# Patient Record
Sex: Female | Born: 1965 | Race: Asian | Hispanic: No | Marital: Married | Smoking: Never smoker
Health system: Southern US, Community
[De-identification: ages and names within clinical notes are randomized; demographics above are authoritative.]

---

## 2009-03-18 DIAGNOSIS — M25561 Pain in right knee: Secondary | ICD-10-CM | POA: Insufficient documentation

## 2019-09-15 DIAGNOSIS — K0889 Other specified disorders of teeth and supporting structures: Secondary | ICD-10-CM | POA: Insufficient documentation

## 2020-03-17 ENCOUNTER — Other Ambulatory Visit: Payer: Self-pay

## 2020-03-22 ENCOUNTER — Other Ambulatory Visit: Payer: Self-pay | Admitting: Internal Medicine

## 2020-03-23 LAB — LIPID PANEL W/O CHOL/HDL RATIO
Cholesterol, Total: 238 mg/dL — ABNORMAL HIGH (ref 100–199)
HDL: 73 mg/dL (ref >39)
LDL Chol Calc (NIH): 148 mg/dL — ABNORMAL HIGH (ref 0–99)
Triglycerides: 97 mg/dL (ref 0–149)
VLDL Cholesterol Cal: 17 mg/dL (ref 5–40)

## 2020-03-23 LAB — URINALYSIS, ROUTINE W REFLEX MICROSCOPIC
Bilirubin, UA: NEGATIVE
Glucose, UA: NEGATIVE
Ketones, UA: NEGATIVE
Nitrite, UA: NEGATIVE
Protein,UA: NEGATIVE
RBC, UA: NEGATIVE
Specific Gravity, UA: 1.015 (ref 1.005–1.030)
Urobilinogen, Ur: 0.2 mg/dL (ref 0.2–1.0)
pH, UA: 5.5 (ref 5.0–7.5)

## 2020-03-23 LAB — CBC WITH DIFFERENTIAL/PLATELET
Basophils Absolute: 0 10*3/uL (ref 0.0–0.2)
Basos: 1 %
EOS (ABSOLUTE): 0.1 10*3/uL (ref 0.0–0.4)
Eos: 1 %
Hematocrit: 43.3 % (ref 34.0–46.6)
Hemoglobin: 14.6 g/dL (ref 11.1–15.9)
Immature Grans (Abs): 0 10*3/uL (ref 0.0–0.1)
Immature Granulocytes: 0 %
Lymphocytes Absolute: 3.4 10*3/uL — ABNORMAL HIGH (ref 0.7–3.1)
Lymphs: 45 %
MCH: 30.3 pg (ref 26.6–33.0)
MCHC: 33.7 g/dL (ref 31.5–35.7)
MCV: 90 fL (ref 79–97)
Monocytes Absolute: 0.7 10*3/uL (ref 0.1–0.9)
Monocytes: 9 %
Neutrophils Absolute: 3.3 10*3/uL (ref 1.4–7.0)
Neutrophils: 44 %
Platelets: 190 10*3/uL (ref 150–450)
RBC: 4.82 x10E6/uL (ref 3.77–5.28)
RDW: 11.6 % — ABNORMAL LOW (ref 11.7–15.4)
WBC: 7.6 10*3/uL (ref 3.4–10.8)

## 2020-03-23 LAB — COMPREHENSIVE METABOLIC PANEL
ALT: 10 IU/L (ref 0–32)
AST: 14 IU/L (ref 0–40)
Albumin/Globulin Ratio: 1.7 (ref 1.2–2.2)
Albumin: 4.6 g/dL (ref 3.8–4.9)
Alkaline Phosphatase: 68 IU/L (ref 44–121)
BUN/Creatinine Ratio: 14 (ref 9–23)
BUN: 11 mg/dL (ref 6–24)
Bilirubin Total: 0.4 mg/dL (ref 0.0–1.2)
CO2: 23 mmol/L (ref 20–29)
Calcium: 9.8 mg/dL (ref 8.7–10.2)
Chloride: 103 mmol/L (ref 96–106)
Creatinine, Ser: 0.76 mg/dL (ref 0.57–1.00)
GFR calc Af Amer: 102 mL/min/{1.73_m2} (ref >59)
GFR calc non Af Amer: 89 mL/min/{1.73_m2} (ref >59)
Globulin, Total: 2.7 g/dL (ref 1.5–4.5)
Glucose: 101 mg/dL — ABNORMAL HIGH (ref 65–99)
Potassium: 4.5 mmol/L (ref 3.5–5.2)
Sodium: 142 mmol/L (ref 134–144)
Total Protein: 7.3 g/dL (ref 6.0–8.5)

## 2020-03-23 LAB — MICROSCOPIC EXAMINATION
Bacteria, UA: NONE SEEN
Casts: NONE SEEN /lpf
RBC, Urine: NONE SEEN /hpf (ref 0–2)

## 2020-03-23 LAB — HGB A1C W/O EAG: Hgb A1c MFr Bld: 5.9 % — ABNORMAL HIGH (ref 4.8–5.6)

## 2020-03-23 LAB — TSH: TSH: 2.35 u[IU]/mL (ref 0.450–4.500)

## 2020-03-23 LAB — T4, FREE: Free T4: 1.26 ng/dL (ref 0.82–1.77)

## 2020-04-08 ENCOUNTER — Ambulatory Visit: Payer: Self-pay | Admitting: Adult Health

## 2020-04-22 ENCOUNTER — Ambulatory Visit: Payer: Self-pay | Admitting: Internal Medicine

## 2020-04-22 ENCOUNTER — Encounter: Payer: Self-pay | Admitting: Internal Medicine

## 2020-04-22 ENCOUNTER — Other Ambulatory Visit: Payer: Self-pay

## 2020-04-22 VITALS — BP 131/93 | HR 64 | Temp 98.0°F | Ht 62.0 in | Wt 137.2 lb

## 2020-04-22 DIAGNOSIS — E785 Hyperlipidemia, unspecified: Secondary | ICD-10-CM

## 2020-04-22 DIAGNOSIS — I10 Essential (primary) hypertension: Secondary | ICD-10-CM | POA: Insufficient documentation

## 2020-04-22 DIAGNOSIS — K029 Dental caries, unspecified: Secondary | ICD-10-CM

## 2020-04-22 NOTE — Assessment & Plan Note (Signed)
Patient has a caries of the teeth, has an ill fitting denture made in her home country which is causing immense pain. She has multiple caries tooth in the front which needs immediate attention by a dentist.  I told her to keep denture off, gave her ampicillin 500 mg for 10 days.

## 2020-04-22 NOTE — Assessment & Plan Note (Signed)
Patient was started on losartin 50 mg daily and was given meds for 3 months.

## 2020-04-22 NOTE — Assessment & Plan Note (Signed)
Patient was advised to follow a no cholesterol diet.

## 2020-04-22 NOTE — Progress Notes (Signed)
Acute Office Visit  Subjective:    Patient ID: Lori Simmons, female    DOB: 1966-01-03, 55 y.o.   MRN: [id number]  No chief complaint on file.   HPI Patient is in today for pain in the tooth, complains of pain from dentures.  The denture was made in [place] and is ill fitting to the teeth. Her teeth were not taken care of so she continued to have multiple problems. Advised not to use dentures, given list of local available dentist who will take care of her teeth.   History reviewed. No pertinent past medical history.  History reviewed. No pertinent surgical history.  History reviewed. No pertinent family history.  Social History   Socioeconomic History  . Marital status: Married    Spouse name: Niamatullah  . Number of children: 7  . Years of education: Not on file  . Highest education level: Not on file  Occupational History  . Occupation: unemployed  Tobacco Use  . Smoking status: Never Smoker  . Smokeless tobacco: Never Used  Vaping Use  . Vaping Use: Never used  Substance and Sexual Activity  . Alcohol use: Never  . Drug use: Never  . Sexual activity: Not on file  Other Topics Concern  . Not on file  Social History Narrative  . Not on file   Social Determinants of Health   Financial Resource Strain: Not on file  Food Insecurity: Not on file  Transportation Needs: Not on file  Physical Activity: Not on file  Stress: Not on file  Social Connections: Not on file  Intimate Partner Violence: Not on file    No outpatient medications prior to visit.   No facility-administered medications prior to visit.    No Known Allergies  Review of Systems  Constitutional: Negative.   HENT: Positive for dental problem.   Eyes: Negative.   Respiratory: Negative.   Cardiovascular: Negative.   Gastrointestinal: Negative.   Endocrine: Negative.   Genitourinary: Negative.   Musculoskeletal: Negative.   Skin: Negative.   Allergic/Immunologic: Negative.    Neurological: Negative.   Hematological: Negative.   Psychiatric/Behavioral: Negative.   All other systems reviewed and are negative.      Objective:    Physical Exam  Multiple caries  Teeth in upper jaw.  Constitutional: The patient is oriented to person, place, and time. Pt appears well-developed and well-nourished.  Head: Normocephalic and atraumatic.  Eyes: Pupils are equal, round, and reactive to light.  Neck: No JVD present. No tracheal deviation present. No thyromegaly present.  Cardiovascular: Regular rate and rhythm. No gallop. Pulmonary/Chest: Normal breath sounds. Lungs clear to auscultation. Abdominal: No abdominal tenderness. No guarding or rebound tenderness. No hepatosplenomegaly. Musculoskeletal: Normal range of motion.  Lymphatic: No cervical adenopathy.  Neurological: No cranial nerve deficit.  Skin: Skin is warm and hydrated.  Psychiatric: The patient has a normal mood and affect. Patient complains of knee and back pain  BP (!) 131/93 (BP Location: Left Arm, Patient Position: Sitting, Cuff Size: Normal)   Pulse 64   Temp 98 F (36.7 C) (Oral)   Ht 5\' 2"  (1.575 m)   Wt 137 lb 3.2 oz (62.2 kg)   SpO2 98%   BMI 25.09 kg/m  Wt Readings from Last 3 Encounters:  04/22/20 137 lb 3.2 oz (62.2 kg)    Health Maintenance Due  Topic Date Due  . Hepatitis C Screening  Never done  . HIV Screening  Never done  . PAP SMEAR-Modifier  Never done  .  COLONOSCOPY (Pts 45-62yrs Insurance coverage will need to be confirmed)  Never done  . MAMMOGRAM  Never done  . INFLUENZA VACCINE  Never done  . COVID-19 Vaccine (2 - Booster for Janssen series) 01/13/2020    There are no preventive care reminders to display for this patient.   Lab Results  Component Value Date   TSH 2.350 03/22/2020   Lab Results  Component Value Date   WBC 7.6 03/22/2020   HGB 14.6 03/22/2020   HCT 43.3 03/22/2020   MCV 90 03/22/2020   PLT 190 03/22/2020   Lab Results  Component Value  Date   NA 142 03/22/2020   K 4.5 03/22/2020   CO2 23 03/22/2020   GLUCOSE 101 (H) 03/22/2020   BUN 11 03/22/2020   CREATININE 0.76 03/22/2020   BILITOT 0.4 03/22/2020   ALKPHOS 68 03/22/2020   AST 14 03/22/2020   ALT 10 03/22/2020   PROT 7.3 03/22/2020   ALBUMIN 4.6 03/22/2020   CALCIUM 9.8 03/22/2020   Lab Results  Component Value Date   CHOL 238 (H) 03/22/2020   Lab Results  Component Value Date   HDL 73 03/22/2020   Lab Results  Component Value Date   LDLCALC 148 (H) 03/22/2020   Lab Results  Component Value Date   TRIG 97 03/22/2020   No results found for: CHOLHDL Lab Results  Component Value Date   HGBA1C 5.9 (H) 03/22/2020       Assessment & Plan:   Problem List Items Addressed This Visit      Cardiovascular and Mediastinum   Essential hypertension    Patient was started on losartin 50 mg daily and was given meds for 3 months.         Digestive   Caries    Patient has a caries of the teeth, has an ill fitting denture made in her [place] which is causing immense pain. She has multiple caries tooth in the front which needs immediate attention by a dentist.  I told her to keep denture off, gave her ampicillin 500 mg for 10 days.         Other   Hyperlipidemia - Primary    Patient was advised to follow a no cholesterol diet.          No orders of the defined types were placed in this encounter.  gave her amoxicillin, and crozat from clinic pharmacy.  Advised to call dentist for an urgent appointment.    Corky Downs, MD

## 2020-06-04 ENCOUNTER — Telehealth (HOSPITAL_COMMUNITY): Payer: Self-pay | Admitting: Emergency Medicine

## 2020-06-04 ENCOUNTER — Encounter (HOSPITAL_COMMUNITY): Payer: Self-pay

## 2020-06-04 ENCOUNTER — Other Ambulatory Visit: Payer: Self-pay

## 2020-06-04 ENCOUNTER — Ambulatory Visit (INDEPENDENT_AMBULATORY_CARE_PROVIDER_SITE_OTHER): Payer: Medicaid Other

## 2020-06-04 ENCOUNTER — Ambulatory Visit (HOSPITAL_COMMUNITY)
Admission: EM | Admit: 2020-06-04 | Discharge: 2020-06-04 | Disposition: A | Discharge status: Home / Self Care | Payer: Medicaid Other | Attending: Emergency Medicine | Admitting: Emergency Medicine

## 2020-06-04 DIAGNOSIS — R0789 Other chest pain: Secondary | ICD-10-CM | POA: Diagnosis not present

## 2020-06-04 DIAGNOSIS — R079 Chest pain, unspecified: Secondary | ICD-10-CM | POA: Diagnosis not present

## 2020-06-04 MED ORDER — IBUPROFEN 800 MG PO TABS
800.0000 mg | ORAL_TABLET | Freq: Once | ORAL | Status: AC
  Administered 2020-06-04: 800 mg via ORAL

## 2020-06-04 MED ORDER — IBUPROFEN 600 MG PO TABS: 600.0000 mg | ORAL_TABLET | Freq: Four times a day (QID) | ORAL | 0 refills | Status: DC | PRN

## 2020-06-04 MED ORDER — IBUPROFEN 800 MG PO TABS
ORAL_TABLET | ORAL | Status: AC
  Filled 2020-06-04: qty 1

## 2020-06-04 MED ORDER — ACETAMINOPHEN 325 MG PO TABS
975.0000 mg | ORAL_TABLET | Freq: Once | ORAL | Status: AC
  Administered 2020-06-04: 975 mg via ORAL

## 2020-06-04 MED ORDER — ACETAMINOPHEN 325 MG PO TABS
ORAL_TABLET | ORAL | Status: AC
  Filled 2020-06-04: qty 3

## 2020-06-04 NOTE — ED Triage Notes (Signed)
Pt present right upper side breast pain. Symptoms started 1 week ago. Pt states the symptoms is painful to the touch. Pt states there is no discharge from the breast.

## 2020-06-04 NOTE — ED Provider Notes (Signed)
HPI  SUBJECTIVE:  Lori Simmons is a 55 y.o. female who presents with right-sided chest pain for the past week.  She states that it is "in her bones".,  And is unable to characterize the pain.  She states that it is constant.  She reports an occasional nonproductive cough, and states that this pain radiates to her shoulder.  She is right-handed, denies any increase or change in her physical activity/lifting/repetitive movement.  She denies breast pain.  No rash.  No trauma to the chest.  No fevers, shortness of breath, hemoptysis, wheezing, night sweats, unintentional weight loss, exertional or positional component, calf pain, swelling, surgery in the past 4 weeks, recent immobilization.  No nausea, diaphoresis, abdominal pain.  She has never had symptoms like this before.  She tried rest, ibuprofen.  The ibuprofen helps.  Symptoms worse with coughing, sneezing, raising her right arm above her head.  It is not associated with inspiration.  She denies pleuritic pain.  She has never had symptoms like this before.  She she thinks that she has a past medical history of hypertension, states that she is not taking any medications for it.  No history of breast cancer, diabetes, cancer, TB, pulmonary disease, smoking, PE, DVT, MI, coronary disease, hypercholesterolemia.  PMD: [facility]  All history obtained through language line.  History reviewed. No pertinent past medical history.  History reviewed. No pertinent surgical history.  History reviewed. No pertinent family history.  Social History   Tobacco Use  . Smoking status: Never Smoker  . Smokeless tobacco: Never Used  Vaping Use  . Vaping Use: Never used  Substance Use Topics  . Alcohol use: Never  . Drug use: Never    No current facility-administered medications for this encounter.  Current Outpatient Medications:  .  ibuprofen (ADVIL) 600 MG tablet, Take 1 tablet (600 mg total) by mouth every 6 (six) hours as needed., Disp: 30  tablet, Rfl: 0 .  losartan (COZAAR) 50 MG tablet, Take 50 mg by mouth daily., Disp: , Rfl:   No Known Allergies   ROS  As noted in HPI.   Physical Exam  BP 136/90 (BP Location: Left Arm)   Pulse 67   Temp 98.1 F (36.7 C) (Oral)   Resp 16   SpO2 97%   Constitutional: Well developed, well nourished, no acute distress Eyes:  EOMI, conjunctiva normal bilaterally HENT: Normocephalic, atraumatic,mucus membranes moist Respiratory: Normal inspiratory effort, lungs clear bilaterally.  Normal chest wall appearance.  No bruising, rash.  Positive isolated right-sided lateral chest wall tenderness. Breast: No right breast tenderness. Lymph: No right axillary lymphadenopathy Cardiovascular: Normal rate, regular rhythm no murmurs rubs or gallops GI: nondistended soft, nontender, negative Murphy.  Active bowel sounds.  No rebound, guarding. skin: No rash, skin intact Musculoskeletal: Calves symmetric, nontender, no edema. Right shoulder nontender. Neurologic: Alert & oriented x 3, no focal neuro deficits Psychiatric: Speech and behavior appropriate   ED Course   Medications  acetaminophen (TYLENOL) tablet 975 mg (975 mg Oral Given 06/04/20 1617)  ibuprofen (ADVIL) tablet 800 mg (800 mg Oral Given 06/04/20 1617)    Orders Placed This Encounter  Procedures  . DG Chest 2 View    Standing Status:   Standing    Number of Occurrences:   1    Order Specific Question:   Reason for Exam (SYMPTOM  OR DIAGNOSIS REQUIRED)    Answer:   right sided CP  . ED EKG    Standing Status:  Standing    Number of Occurrences:   1    Order Specific Question:   Reason for Exam    Answer:   Chest Pain    No results found for this or any previous visit (from the past 24 hour(s)). DG Chest 2 View  Result Date: 06/04/2020 CLINICAL DATA:  Right side chest pain EXAM: CHEST - 2 VIEW COMPARISON:  None. FINDINGS: Heart and mediastinal contours are within normal limits. No focal opacities or effusions. No  acute bony abnormality. IMPRESSION: No active cardiopulmonary disease. Electronically Signed   By: Charlett Nose M.D.   On: 06/04/2020 16:31    ED Clinical Impression  1. Chest wall pain      ED Assessment/Plan  Presentation consistent with musculoskeletal chest pain, however, will check an EKG and chest x-ray.  Vitals normal, calves symmetric, nontender, doubt  PE.  Giving ibuprofen 800 mg and 975 mg of Tylenol here.  If everything is normal, plan to send home with 600 mg of ibuprofen combined with 1000 milligrams Tylenol 3-4 times a day.  Follow-up with the [facility] in 3-5 days.  Strict ER return precautions given.  Reviewed imaging independently.  Normal chest x-ray.  See radiology report for full details.  EKG: Sinus bradycardia, rate 57.  Left axis deviation.  Normal intervals.  No hypertrophy.  No ST elevation.  T wave inversion from previous EKG 03/17/2020 resolved.  EKG, chest x-ray reassuring.  Plan as above.  Spent over 45 minutes obtaining H&P, discussing medical decision-making, work-up, treatment plan, plan for follow-up and ER return precautions with patient using the language line. patient agrees with plan.   Meds ordered this encounter  Medications  . acetaminophen (TYLENOL) tablet 975 mg  . ibuprofen (ADVIL) tablet 800 mg  . ibuprofen (ADVIL) 600 MG tablet    Sig: Take 1 tablet (600 mg total) by mouth every 6 (six) hours as needed.    Dispense:  30 tablet    Refill:  0    *This clinic note was created using Scientist, clinical (histocompatibility and immunogenetics). Therefore, there may be occasional mistakes despite careful proofreading.   ?    Domenick Gong, MD 06/05/20 [phone]

## 2020-06-04 NOTE — Discharge Instructions (Addendum)
Your EKG and chest x-ray were normal.  I suspect that this is musculoskeletal.  Take 600 mg of ibuprofen combined with 1000 mg of Tylenol together 3-4 times a day as needed for pain.  Follow-up with your doctor if not getting better in 2 days, go immediately to the ER if you get worse, or for other concerns.

## 2020-06-04 NOTE — ED Notes (Signed)
Patient transported to X-ray 

## 2021-02-27 ENCOUNTER — Telehealth: Payer: Self-pay

## 2021-02-27 NOTE — Telephone Encounter (Signed)
Patient is requesting transportation assistance, ride scheduled with [employer] assistance. Pick up time 1:41pm on December 14th.  Nicole Cella Nachmen Mansel RN [facility]  [facility] [phone]

## 2022-01-23 ENCOUNTER — Ambulatory Visit: Payer: Medicaid Other

## 2022-01-23 DIAGNOSIS — Z23 Encounter for immunization: Secondary | ICD-10-CM

## 2022-08-26 IMAGING — DX DG CHEST 2V
2 series · 2 of 2 positions shown · non-contrast
Comparison: None.

CLINICAL DATA: Right side chest pain

EXAM:
CHEST - 2 VIEW

[chest pa]
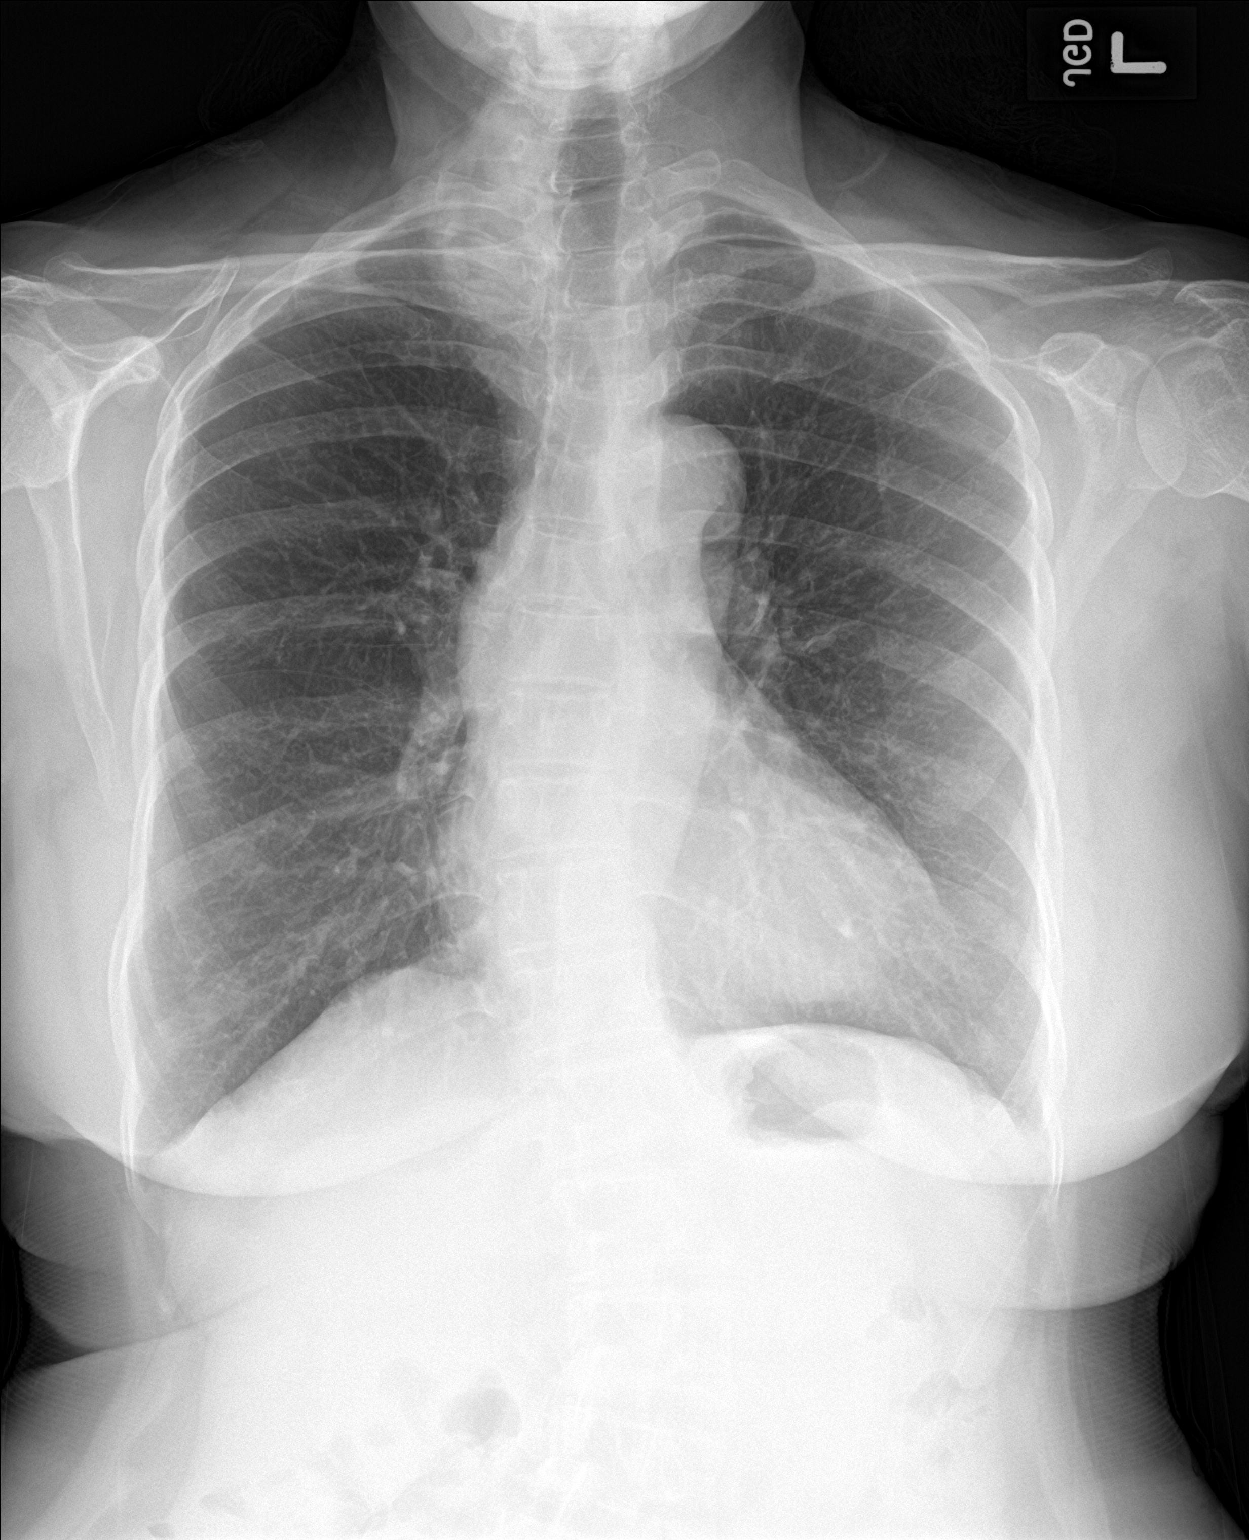

[chest lat]
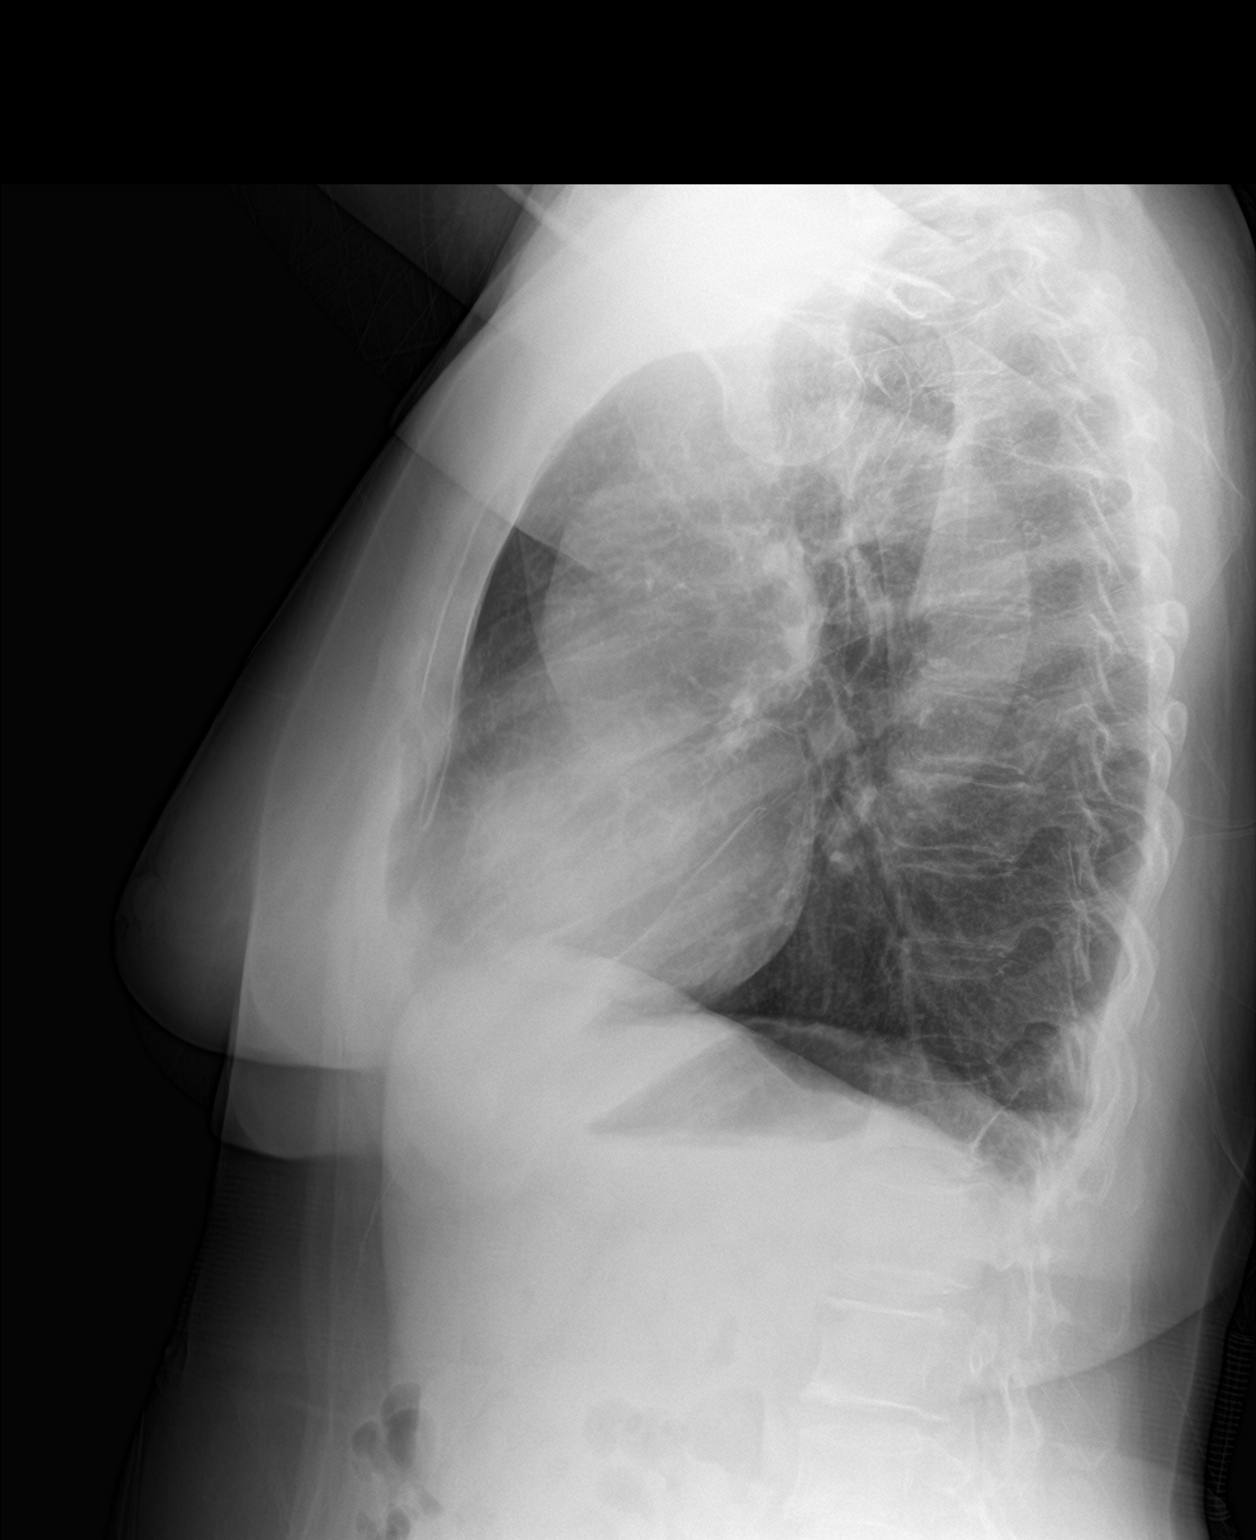

[2 of 2 positions shown; findings below may reference images not displayed]

FINDINGS: Heart and mediastinal contours are within normal limits. No focal
opacities or effusions. No acute bony abnormality.
IMPRESSION: No active cardiopulmonary disease.

## 2022-11-25 ENCOUNTER — Ambulatory Visit (HOSPITAL_COMMUNITY)
Admission: EM | Admit: 2022-11-25 | Discharge: 2022-11-25 | Disposition: A | Discharge status: Home / Self Care | Payer: Self-pay | Attending: Emergency Medicine | Admitting: Emergency Medicine

## 2022-11-25 ENCOUNTER — Other Ambulatory Visit: Payer: Self-pay

## 2022-11-25 ENCOUNTER — Encounter (HOSPITAL_COMMUNITY): Payer: Self-pay | Admitting: *Deleted

## 2022-11-25 DIAGNOSIS — L239 Allergic contact dermatitis, unspecified cause: Secondary | ICD-10-CM

## 2022-11-25 MED ORDER — PREDNISONE 10 MG (21) PO TBPK: ORAL_TABLET | Freq: Every day | ORAL | 0 refills | Status: DC

## 2022-11-25 MED ORDER — METHYLPREDNISOLONE ACETATE 40 MG/ML IJ SUSP
40.0000 mg | Freq: Once | INTRAMUSCULAR | Status: AC
  Administered 2022-11-25: 40 mg via INTRAMUSCULAR

## 2022-11-25 MED ORDER — METHYLPREDNISOLONE ACETATE 40 MG/ML IJ SUSP
INTRAMUSCULAR | Status: AC
  Filled 2022-11-25: qty 1

## 2022-11-25 NOTE — ED Provider Notes (Signed)
[facility]    CSN: [id number] Arrival date & time: 11/25/22  1611      History   Chief Complaint Chief Complaint  Patient presents with   Facial Swelling    HPI Lori Simmons is a 57 y.o. female.   Patient reports since with rash to face and neck with intermittent facial swelling x 1 week. Patient reports taking Benadryl at bedtime to help with itching with relief. Denies fever and shortness of breath. Unsure of triggers.     History reviewed. No pertinent past medical history.  Patient Active Problem List   Diagnosis Date Noted   Hyperlipidemia 04/22/2020   Essential hypertension 04/22/2020   Caries 04/22/2020   Pain in a tooth or teeth 09/15/2019   Knee pain, bilateral 2011    History reviewed. No pertinent surgical history.  OB History   No obstetric history on file.      Home Medications    Prior to Admission medications   Medication Sig Start Date End Date Taking? Authorizing Provider  predniSONE (STERAPRED UNI-PAK 21 TAB) 10 MG (21) TBPK tablet Take by mouth daily. Take 6 tabs by mouth daily  for 2 days, then 5 tabs for 2 days, then 4 tabs for 2 days, then 3 tabs for 2 days, 2 tabs for 2 days, then 1 tab by mouth daily for 2 days 11/25/22  Yes Letta Kocher, NP    Family History History reviewed. No pertinent family history.  Social History Social History   Tobacco Use   Smoking status: Never   Smokeless tobacco: Never  Vaping Use   Vaping status: Never Used  Substance Use Topics   Alcohol use: Never   Drug use: Never     Allergies   Patient has no known allergies.   Review of Systems Review of Systems  Constitutional:  Negative for activity change, chills, fatigue and fever.  Respiratory:  Negative for shortness of breath.   Skin:  Positive for rash.     Physical Exam Triage Vital Signs ED Triage Vitals  Encounter Vitals Group     BP 11/25/22 1852 118/79     Systolic BP Percentile --      Diastolic BP  Percentile --      Pulse Rate 11/25/22 1852 60     Resp 11/25/22 1852 18     Temp 11/25/22 1852 98.6 F (37 C)     Temp src --      SpO2 11/25/22 1852 96 %     Weight --      Height --      Head Circumference --      Peak Flow --      Pain Score 11/25/22 1849 0     Pain Loc --      Pain Education --      Exclude from Growth Chart --    No data found.  Updated Vital Signs BP 118/79   Pulse 60   Temp 98.6 F (37 C)   Resp 18   SpO2 96%   Visual Acuity Right Eye Distance:   Left Eye Distance:   Bilateral Distance:    Right Eye Near:   Left Eye Near:    Bilateral Near:     Physical Exam Vitals and nursing note reviewed.  Constitutional:      General: She is awake. She is not in acute distress.    Appearance: Normal appearance. She is well-developed and well-groomed. She is not ill-appearing, toxic-appearing  or diaphoretic.  HENT:     Nose: Nose normal.     Mouth/Throat:     Mouth: Mucous membranes are moist.     Pharynx: Oropharynx is clear.  Musculoskeletal:     Cervical back: Normal range of motion. Erythema present.  Skin:    General: Skin is warm and dry.     Findings: Erythema and rash present. No bruising, lesion or petechiae. Rash is macular.     Comments: Macular rash to eyelids, under eyes, and to neck.   Neurological:     Mental Status: She is alert.  Psychiatric:        Behavior: Behavior is cooperative.      UC Treatments / Results  Labs (all labs ordered are listed, but only abnormal results are displayed) Labs Reviewed - No data to display  EKG   Radiology No results found.  Procedures Procedures (including critical care time)  Medications Ordered in UC Medications  methylPREDNISolone acetate (DEPO-MEDROL) injection 40 mg (40 mg Intramuscular Given 11/25/22 1913)    Initial Impression / Assessment and Plan / UC Course  I have reviewed the triage vital signs and the nursing notes.  Pertinent labs & imaging results that were  available during my care of the patient were reviewed by me and considered in my medical decision making (see chart for details).     Patient presented with rash to face and neck with intermittent facial swelling x 1 week.  Patient reports taking Benadryl at bedtime to help with itching with some relief. Denies fever rash of breath. Unsure of triggers. Patient has macular rash to bilateral eyelids, under eyes, and neck.  Patient does not have facial swelling at this time. Given steroid injection in clinic.  Prescribed prednisone dose pack.  Recommended continuing Benadryl if it helps with itching. Given ambulatory referral to allergist. Discussed follow-up and return protocols Final Clinical Impressions(s) / UC Diagnoses   Final diagnoses:  Allergic contact dermatitis, unspecified trigger     Discharge Instructions      Start taking Prednisone as prescribed until finished to help with rash. Otherwise continue using Benadryl for itching. Only take 50mg  at a time. Return here if symptoms persist. You can also follow up with allergy specialist to help determine possible triggers.     ED Prescriptions     Medication Sig Dispense Auth. Provider   predniSONE (STERAPRED UNI-PAK 21 TAB) 10 MG (21) TBPK tablet Take by mouth daily. Take 6 tabs by mouth daily  for 2 days, then 5 tabs for 2 days, then 4 tabs for 2 days, then 3 tabs for 2 days, 2 tabs for 2 days, then 1 tab by mouth daily for 2 days 42 tablet Wynonia Lawman A, NP      PDMP not reviewed this encounter.   Wynonia Lawman A, NP 11/25/22 [phone]

## 2022-11-25 NOTE — ED Triage Notes (Signed)
Pt has had facial swelling to face for one week. Family reports allergy but does not know the cause. Pt took benadryl four caps at bedtime for 4 days. Facial swelling has not improved.

## 2022-11-25 NOTE — Discharge Instructions (Addendum)
Start taking Prednisone as prescribed until finished to help with rash. Otherwise continue using Benadryl for itching. Only take 50mg  at a time. Return here if symptoms persist. You can also follow up with allergy specialist to help determine possible triggers.

## 2023-01-03 ENCOUNTER — Ambulatory Visit: Payer: Self-pay

## 2023-01-03 ENCOUNTER — Ambulatory Visit
Admission: EM | Admit: 2023-01-03 | Discharge: 2023-01-03 | Disposition: A | Discharge status: Home / Self Care | Payer: BLUE CROSS/BLUE SHIELD | Attending: Physician Assistant | Admitting: Physician Assistant

## 2023-01-03 ENCOUNTER — Encounter: Payer: Self-pay | Admitting: Internal Medicine

## 2023-01-03 DIAGNOSIS — L209 Atopic dermatitis, unspecified: Secondary | ICD-10-CM

## 2023-01-03 DIAGNOSIS — H01132 Eczematous dermatitis of right lower eyelid: Secondary | ICD-10-CM

## 2023-01-03 DIAGNOSIS — H01134 Eczematous dermatitis of left upper eyelid: Secondary | ICD-10-CM

## 2023-01-03 DIAGNOSIS — H01135 Eczematous dermatitis of left lower eyelid: Secondary | ICD-10-CM

## 2023-01-03 DIAGNOSIS — H01131 Eczematous dermatitis of right upper eyelid: Secondary | ICD-10-CM

## 2023-01-03 MED ORDER — CETIRIZINE HCL 10 MG PO TABS: 10.0000 mg | ORAL_TABLET | Freq: Every day | ORAL | 2 refills | Status: DC

## 2023-01-03 MED ORDER — HYDROCORTISONE 0.5 % EX CREA: 1.0000 | TOPICAL_CREAM | Freq: Two times a day (BID) | CUTANEOUS | 0 refills | Status: AC

## 2023-01-03 NOTE — ED Triage Notes (Addendum)
Pt presents to UC w/ c/o pain, redness and itching around eyes started after last dose of prednisone from last visit. Pt states at times there is "a film over her eyes."  Small blister present on right upper eyelid. Rash has also come up around her mouth and neck.

## 2023-01-03 NOTE — Discharge Instructions (Signed)
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  Give cetirizine at night.  Use hydrocortisone cream on the areas surrounding her eyes up to twice a day for 2 weeks.  Do not use this for longer than 2 weeks as it can cause thinning of the skin.  Try to avoid scratching or irritating the area.  Use hypoallergenic soaps and detergents.  Avoid cosmetics.  If symptoms are not improving within a week or if anything worsens and we have additional rash, fever, nausea, vomiting she should be seen immediately.  ??? ?? ??? ??? ?????? ?????? ???.  ?? ??? ?? cetirizine ?????.  ? ?????????????? ???? ? ????? ?????? ???? ?? ? 2 ????? ????? ?? ??? ?? ??? ??? ??????.  ?? ? 2 ????? ??? ??? ?? ????? ??? ?? ?? ???? ?? ? ?????? ?????? ???? ??.  ??? ???? ?? ? ???? ???? ?? ???? ??? ?????? ????.  ????????????? ????? ?? ????? ??????.  ? ???????? ??? ??? ????.  ?? ? ??? ???? ?? ????? ?? ??? ?? ??? ?? ??? ?? ???? ?? ?? ??? ????? ???? ? ??? ? ??????? ? ????? ???? ??? ???? ??????? ????? ??. zuma paa und valagha alarjik akazima lari.  paa shpa ke cetirizine warkri.  da valydrokortixon krem da stargu shaokhwa simo ke da 2 awnio lapara paa warz ke dawa zala wakarwai.  da da 2 awnio sakha der maa Sander Nephew zaka chi da [place] shi da postaki narmedo lamal shi.  Roselyn Reef chi da sahey kharkh ya kharkh sakha mahnewi wakri.  valipowalarjinik sabun ao sabun wakarwai.  da kasametax sakha dada [place] da yowe awani paa ugdo ke nakhe kha nashi ya kom saa [place] shi ao mog izafi [place] , [place] , [place] , kangi walaro valagha bayed samdalasa walidal shi.

## 2023-01-03 NOTE — ED Provider Notes (Signed)
[facility]    CSN: [id number] Arrival date & time: 01/03/23  1621      History   Chief Complaint No chief complaint on file.   HPI Lori Simmons is a 57 y.o. female.   Patient presents today accompanied by her children who provided translation and the majority of history.  Reports a prolonged (many months) history of erythematous and pruritic rash involving periorbital region.  She was seen for similar symptoms back in September 2024 for which when she was given a prednisone taper.  Her symptoms temporarily improved with this medication only to recur as soon as the medicine was stopped.  She was encouraged to follow-up with a primary care provider and call to schedule appointment but this was not until 02/19/2023.  She does not wear make-up regularly.  Denies any changes to personal hygiene products including soaps or detergents.  Denies any swelling of her throat, shortness of breath, muffled voice, wheezing, nausea, vomiting.  She has not tried any over-the-counter medication for symptom management.  She does report intermittent nasal congestion but denies additional symptoms.    History reviewed. No pertinent past medical history.  Patient Active Problem List   Diagnosis Date Noted   Hyperlipidemia 04/22/2020   Essential hypertension 04/22/2020   Caries 04/22/2020   Pain in a tooth or teeth 09/15/2019   Knee pain, bilateral 2011    History reviewed. No pertinent surgical history.  OB History   No obstetric history on file.      Home Medications    Prior to Admission medications   Medication Sig Start Date End Date Taking? Authorizing Provider  cetirizine (ZYRTEC ALLERGY) 10 MG tablet Take 1 tablet (10 mg total) by mouth at bedtime. 01/03/23  Yes Nyheim Seufert K, PA-C  hydrocortisone cream 0.5 % Apply 1 Application topically 2 (two) times daily. 01/03/23  Yes Keidrick Murty, Noberto Retort, PA-C    Family History History reviewed. No pertinent family history.  Social  History Social History   Tobacco Use   Smoking status: Never   Smokeless tobacco: Never  Vaping Use   Vaping status: Never Used  Substance Use Topics   Alcohol use: Never   Drug use: Never     Allergies   Patient has no known allergies.   Review of Systems Review of Systems  Constitutional:  Positive for activity change. Negative for appetite change, fatigue and fever.  HENT:  Negative for trouble swallowing and voice change.   Eyes:  Negative for photophobia and visual disturbance.  Gastrointestinal:  Negative for abdominal pain, diarrhea, nausea and vomiting.  Musculoskeletal:  Negative for arthralgias and myalgias.  Skin:  Positive for rash.  Neurological:  Negative for dizziness, light-headedness and headaches.     Physical Exam Triage Vital Signs ED Triage Vitals [01/03/23 1631]  Encounter Vitals Group     BP 130/84     Systolic BP Percentile      Diastolic BP Percentile      Pulse Rate 73     Resp 16     Temp 98.2 F (36.8 C)     Temp Source Oral     SpO2 94 %     Weight      Height      Head Circumference      Peak Flow      Pain Score 2     Pain Loc      Pain Education      Exclude from Growth Chart  No data found.  Updated Vital Signs BP 130/84 (BP Location: Right Arm)   Pulse 73   Temp 98.2 F (36.8 C) (Oral)   Resp 16   SpO2 94%   Visual Acuity Right Eye Distance:   Left Eye Distance:   Bilateral Distance:    Right Eye Near:   Left Eye Near:    Bilateral Near:     Physical Exam Vitals reviewed.  Constitutional:      General: She is awake. She is not in acute distress.    Appearance: Normal appearance. She is well-developed. She is not ill-appearing.     Comments: Very pleasant female appears stated age in no acute distress sitting comfortably in exam room  HENT:     Head: Normocephalic and atraumatic.     Right Ear: Tympanic membrane, ear canal and external ear normal. Tympanic membrane is not erythematous or bulging.      Left Ear: Tympanic membrane, ear canal and external ear normal. Tympanic membrane is not erythematous or bulging.     Nose:     Right Sinus: No maxillary sinus tenderness or frontal sinus tenderness.     Left Sinus: No maxillary sinus tenderness or frontal sinus tenderness.     Mouth/Throat:     Pharynx: Uvula midline. Postnasal drip present. No oropharyngeal exudate or posterior oropharyngeal erythema.  Eyes:     Extraocular Movements: Extraocular movements intact.     Conjunctiva/sclera: Conjunctivae normal.     Pupils: Pupils are equal, round, and reactive to light.     Comments: Erythematous rash left periorbital region bilaterally with evidence of excoriation.  No scaling, streaking, bleeding, additional lesions noted.  Cardiovascular:     Rate and Rhythm: Normal rate and regular rhythm.     Heart sounds: Normal heart sounds, S1 normal and S2 normal. No murmur heard. Pulmonary:     Effort: Pulmonary effort is normal.     Breath sounds: Normal breath sounds. No wheezing, rhonchi or rales.     Comments: Clear to auscultation bilaterally Psychiatric:        Behavior: Behavior is cooperative.      UC Treatments / Results  Labs (all labs ordered are listed, but only abnormal results are displayed) Labs Reviewed - No data to display  EKG   Radiology No results found.  Procedures Procedures (including critical care time)  Medications Ordered in UC Medications - No data to display  Initial Impression / Assessment and Plan / UC Course  I have reviewed the triage vital signs and the nursing notes.  Pertinent labs & imaging results that were available during my care of the patient were reviewed by me and considered in my medical decision making (see chart for details).     Patient is well-appearing, afebrile, nontoxic, nontachycardic.  No evidence of acute infection that would warrant initiation of antibiotics.  Discussed symptoms are most likely related to eczema given  clinical presentation.  Will start cetirizine daily and add hydrocortisone 0.5% up to twice a day for 2 weeks.  We discussed that this should not be used for more than 2 weeks due to concern for thinning of the skin and other complications.  She is to use hypoallergenic soaps and detergents and recommend that she avoid cosmetics.  Recommend close follow-up with primary care but if they are unable to see them any sooner and she continues to have symptoms she can return here for reevaluation.  Discussed that if anything worsens or changes she should return  for reevaluation.  All questions were answered to patient and caregiver satisfaction.  Final Clinical Impressions(s) / UC Diagnoses   Final diagnoses:  Atopic dermatitis, unspecified type  Eczematous dermatitis of upper and lower eyelids of both eyes     Discharge Instructions      I think she has allergic eczema.  Give cetirizine at night.  Use hydrocortisone cream on the areas surrounding her eyes up to twice a day for 2 weeks.  Do not use this for longer than 2 weeks as it can cause thinning of the skin.  Try to avoid scratching or irritating the area.  Use hypoallergenic soaps and detergents.  Avoid cosmetics.  If symptoms are not improving within a week or if anything worsens and we have additional rash, fever, nausea, vomiting she should be seen immediately.  ??? ?? ??? ??? ?????? ?????? ???.  ?? ??? ?? cetirizine ?????.  ? ?????????????? ???? ? ????? ?????? ???? ?? ? 2 ????? ????? ?? ??? ?? ??? ??? ??????.  ?? ? 2 ????? ??? ??? ?? ????? ??? ?? ?? ???? ?? ? ?????? ?????? ???? ??.  ??? ???? ?? ? ???? ???? ?? ???? ??? ?????? ????.  ????????????? ????? ?? ????? ??????.  ? ???????? ??? ??? ????.  ?? ? ??? ???? ?? ????? ?? ??? ?? ??? ?? ??? ?? ???? ?? ?? ??? ????? ???? ? ??? ? ??????? ? ????? ???? ??? ???? ??????? ????? ??. zuma paa und valagha alarjik akazima lari.  paa shpa ke cetirizine warkri.  da valydrokortixon krem da stargu shaokhwa simo ke  da 2 awnio lapara paa warz ke dawa zala wakarwai.  da da 2 awnio sakha der maa Sander Nephew zaka chi da [place] shi da postaki narmedo lamal shi.  Roselyn Reef chi da sahey kharkh ya kharkh sakha mahnewi wakri.  valipowalarjinik sabun ao sabun wakarwai.  da kasametax sakha dada [place] da yowe awani paa ugdo ke nakhe kha nashi ya kom saa [place] shi ao mog izafi [employer] , [place] , [place] , kangi walaro valagha bayed samdalasa walidal shi.     ED Prescriptions     Medication Sig Dispense Auth. Provider   cetirizine (ZYRTEC ALLERGY) 10 MG tablet Take 1 tablet (10 mg total) by mouth at bedtime. 30 tablet Neala Miggins K, PA-C   hydrocortisone cream 0.5 % Apply 1 Application topically 2 (two) times daily. 30 g Talha Iser, Noberto Retort, PA-C      PDMP not reviewed this encounter.   Jeani Hawking, PA-C 01/03/23 1654

## 2023-01-27 ENCOUNTER — Encounter: Payer: Self-pay | Admitting: Emergency Medicine

## 2023-01-27 ENCOUNTER — Other Ambulatory Visit: Payer: Self-pay

## 2023-01-27 ENCOUNTER — Ambulatory Visit
Admission: EM | Admit: 2023-01-27 | Discharge: 2023-01-27 | Disposition: A | Discharge status: Home / Self Care | Payer: BLUE CROSS/BLUE SHIELD | Attending: Internal Medicine | Admitting: Internal Medicine

## 2023-01-27 DIAGNOSIS — L209 Atopic dermatitis, unspecified: Secondary | ICD-10-CM | POA: Diagnosis not present

## 2023-01-27 DIAGNOSIS — H1012 Acute atopic conjunctivitis, left eye: Secondary | ICD-10-CM | POA: Diagnosis not present

## 2023-01-27 MED ORDER — TRIAMCINOLONE ACETONIDE 0.025 % EX OINT: 1.0000 | TOPICAL_OINTMENT | Freq: Two times a day (BID) | CUTANEOUS | 0 refills | Status: AC

## 2023-01-27 MED ORDER — CETIRIZINE HCL 10 MG PO TABS: 10.0000 mg | ORAL_TABLET | Freq: Every day | ORAL | 2 refills | Status: AC

## 2023-01-27 MED ORDER — AZELASTINE HCL 0.05 % OP SOLN
1.0000 [drp] | Freq: Two times a day (BID) | OPHTHALMIC | 0 refills | Status: AC
Start: 2023-01-27 — End: 2023-02-03

## 2023-01-27 NOTE — ED Notes (Signed)
Patient's adult son is aware of both appts for patient -the pcp appt and allergist appt

## 2023-01-27 NOTE — ED Provider Notes (Signed)
[facility]    CSN: [id number] Arrival date & time: 01/27/23  1558      History   Chief Complaint Chief Complaint  Patient presents with   Rash    HPI Lori Simmons is a 57 y.o. female presents for rash.  Patient is accompanied by family who helps to translate.  Patient has been seen 2 previous times in urgent care for atopic dermatitis.  First was in September when she was prescribed a prednisone taper.  Reports symptoms improved but then returned once she completed this.  She was again seen on October 18 for the same complaint.  She was prescribed cetirizine and hydrocortisone cream.  States this helped some but did not completely resolve.  Also endorses about a week of right eye redness with itching and watery drainage.  Denies history of eczema or psoriasis.  She has a follow-up appointment on December 4 with an allergist.  She also has an appointment with the PCP on November 18.  No other concerns at this time   Rash   History reviewed. No pertinent past medical history.  Patient Active Problem List   Diagnosis Date Noted   Hyperlipidemia 04/22/2020   Essential hypertension 04/22/2020   Caries 04/22/2020   Pain in a tooth or teeth 09/15/2019   Knee pain, bilateral 2011    History reviewed. No pertinent surgical history.  OB History   No obstetric history on file.      Home Medications    Prior to Admission medications   Medication Sig Start Date End Date Taking? Authorizing Provider  azelastine (OPTIVAR) 0.05 % ophthalmic solution Place 1 drop into the left eye 2 (two) times daily for 7 days. 01/27/23 02/03/23 Yes Radford Pax, NP  triamcinolone (KENALOG) 0.025 % ointment Apply 1 Application topically 2 (two) times daily. Apply sparingly to the neck and face.  Avoid eyes.  Do not use for more than 7 days. 01/27/23  Yes Radford Pax, NP  cetirizine (ZYRTEC ALLERGY) 10 MG tablet Take 1 tablet (10 mg total) by mouth at bedtime. 01/27/23   Radford Pax, NP  hydrocortisone cream 0.5 % Apply 1 Application topically 2 (two) times daily. 01/03/23   Raspet, Noberto Retort, PA-C    Family History History reviewed. No pertinent family history.  Social History Social History   Tobacco Use   Smoking status: Never   Smokeless tobacco: Never  Vaping Use   Vaping status: Never Used  Substance Use Topics   Alcohol use: Never   Drug use: Never     Allergies   Patient has no known allergies.   Review of Systems Review of Systems  Skin:  Positive for rash.     Physical Exam Triage Vital Signs ED Triage Vitals  Encounter Vitals Group     BP 01/27/23 1634 135/81     Systolic BP Percentile --      Diastolic BP Percentile --      Pulse Rate 01/27/23 1634 77     Resp 01/27/23 1634 18     Temp 01/27/23 1634 98.9 F (37.2 C)     Temp Source 01/27/23 1634 Oral     SpO2 01/27/23 1634 94 %     Weight --      Height --      Head Circumference --      Peak Flow --      Pain Score 01/27/23 1629 0     Pain Loc --  Pain Education --      Exclude from Growth Chart --    No data found.  Updated Vital Signs BP 135/81 (BP Location: Left Arm)   Pulse 77   Temp 98.9 F (37.2 C) (Oral)   Resp 18   SpO2 94%   Visual Acuity Right Eye Distance:   Left Eye Distance:   Bilateral Distance:    Right Eye Near:   Left Eye Near:    Bilateral Near:     Physical Exam Vitals and nursing note reviewed.  Constitutional:      General: She is not in acute distress.    Appearance: Normal appearance. She is not ill-appearing.  Eyes:     General: Lids are normal.     Conjunctiva/sclera:     Right eye: No chemosis, exudate or hemorrhage.    Pupils: Pupils are equal, round, and reactive to light.     Comments: Mild injection of conjunctiva on the left with watery drainage noted.  Cardiovascular:     Rate and Rhythm: Normal rate.  Pulmonary:     Effort: Pulmonary effort is normal.  Skin:    General: Skin is warm and dry.     Findings:  Rash present. Rash is macular and papular. Rash is not crusting, nodular, purpuric, pustular, scaling, urticarial or vesicular.     Comments: Scattered macular papular erythematous rash with dry skin noted on face, neck, chest, arms.  Neurological:     General: No focal deficit present.     Mental Status: She is alert and oriented to person, place, and time.  Psychiatric:        Mood and Affect: Mood normal.        Behavior: Behavior normal.      UC Treatments / Results  Labs (all labs ordered are listed, but only abnormal results are displayed) Labs Reviewed - No data to display  EKG   Radiology No results found.  Procedures Procedures (including critical care time)  Medications Ordered in UC Medications - No data to display  Initial Impression / Assessment and Plan / UC Course  I have reviewed the triage vital signs and the nursing notes.  Pertinent labs & imaging results that were available during my care of the patient were reviewed by me and considered in my medical decision making (see chart for details).     Reviewed exam and symptoms with patient and family.  No red flags.  Will start in histamine eyedrops as prescribed.  Requested refill for her cetirizine which was sent to pharmacy.  Will do trial of low-dose Kenalog to rash areas.  Instructed to use sparingly on face and for no more than 7 days.  Patient to follow-up with allergist on December 4 and PCP next week.  Strict ER precautions reviewed and patient and family verbalized understanding. Final Clinical Impressions(s) / UC Diagnoses   Final diagnoses:  Atopic dermatitis, unspecified type  Allergic conjunctivitis of left eye     Discharge Instructions      Continue cetirizine daily.  Start Optivar antihistamine eyedrops twice daily as needed to your eyes.  May use triamcinolone steroid cream to rash areas.  Use very sparingly on the face.  Do not use around eyes.  Do not use more than 7 days.  Please get  over-the-counter Aquaphor lotion and you may apply this generously to the rash areas as needed.  Follow-up with your allergist on December 4.  Please go to the ER for any worsening  symptoms.  I hope you feel better soon!    ED Prescriptions     Medication Sig Dispense Auth. Provider   cetirizine (ZYRTEC ALLERGY) 10 MG tablet Take 1 tablet (10 mg total) by mouth at bedtime. 30 tablet Radford Pax, NP   triamcinolone (KENALOG) 0.025 % ointment Apply 1 Application topically 2 (two) times daily. Apply sparingly to the neck and face.  Avoid eyes.  Do not use for more than 7 days. 30 g Radford Pax, NP   azelastine (OPTIVAR) 0.05 % ophthalmic solution Place 1 drop into the left eye 2 (two) times daily for 7 days. 6 mL Radford Pax, NP      PDMP not reviewed this encounter.   Radford Pax, NP 01/27/23 [phone]

## 2023-01-27 NOTE — Discharge Instructions (Addendum)
Continue cetirizine daily.  Start Optivar antihistamine eyedrops twice daily as needed to your eyes.  May use triamcinolone steroid cream to rash areas.  Use very sparingly on the face.  Do not use around eyes.  Do not use more than 7 days.  Please get over-the-counter Aquaphor lotion and you may apply this generously to the rash areas as needed.  Follow-up with your allergist on December 4.  Please go to the ER for any worsening symptoms.  I hope you feel better soon!

## 2023-01-27 NOTE — ED Triage Notes (Signed)
Patient has a rash all over her face and body.  Eyes red.  Skin is itching.  Patient has been seen for this and medicine did help.  However, it is getting worse again.  Patient has a new patient appt with a pcp in December and was told if things worsened prior to pcp appt to come to urgent.  All prescribed medicine is gone now

## 2023-01-28 ENCOUNTER — Ambulatory Visit: Payer: Self-pay

## 2023-02-03 ENCOUNTER — Ambulatory Visit (HOSPITAL_BASED_OUTPATIENT_CLINIC_OR_DEPARTMENT_OTHER): Payer: BLUE CROSS/BLUE SHIELD | Admitting: Family Medicine

## 2023-02-04 ENCOUNTER — Other Ambulatory Visit: Payer: Self-pay

## 2023-02-04 ENCOUNTER — Encounter: Payer: Self-pay | Admitting: Internal Medicine

## 2023-02-04 ENCOUNTER — Ambulatory Visit: Payer: BLUE CROSS/BLUE SHIELD | Admitting: Internal Medicine

## 2023-02-04 VITALS — BP 128/66 | HR 65 | Temp 97.8°F | Resp 20 | Ht 60.75 in | Wt 137.5 lb

## 2023-02-04 DIAGNOSIS — L2089 Other atopic dermatitis: Secondary | ICD-10-CM

## 2023-02-04 DIAGNOSIS — H1045 Other chronic allergic conjunctivitis: Secondary | ICD-10-CM | POA: Diagnosis not present

## 2023-02-04 DIAGNOSIS — J3089 Other allergic rhinitis: Secondary | ICD-10-CM | POA: Diagnosis not present

## 2023-02-04 DIAGNOSIS — K029 Dental caries, unspecified: Secondary | ICD-10-CM | POA: Diagnosis not present

## 2023-02-04 MED ORDER — METHYLPREDNISOLONE ACETATE 40 MG/ML IJ SUSP
40.0000 mg | Freq: Once | INTRAMUSCULAR | Status: AC
  Administered 2023-02-04: 40 mg via INTRAMUSCULAR

## 2023-02-04 MED ORDER — PREDNISONE 10 MG PO TABS: ORAL_TABLET | ORAL | 0 refills | Status: AC

## 2023-02-04 MED ORDER — CROMOLYN SODIUM 4 % OP SOLN: 1.0000 [drp] | Freq: Four times a day (QID) | OPHTHALMIC | 12 refills | Status: AC

## 2023-02-04 MED ORDER — TACROLIMUS 0.1 % EX OINT: TOPICAL_OINTMENT | Freq: Two times a day (BID) | CUTANEOUS | 0 refills | Status: AC

## 2023-02-04 NOTE — Progress Notes (Signed)
NEW PATIENT Date of Service/Encounter:  02/04/23 Referring provider: none-self referred Primary care provider: Pcp, No  Subjective:  Lori Simmons is a 57 y.o. female  presenting today for evaluation of facial rash  History obtained from: chart review and patient and Interpreter  .   Discussed the use of AI scribe software for clinical note transcription with the patient, who gave verbal consent to proceed.  History of Present Illness   The patient, Lori Simmons, presents with a chief complaint of eye itching and a rash that began around September. The symptoms have led to three urgent care visits where various treatments were administered, including prednisone, Zyrtec, hydrocortisone, eye drops, and triamcinolone. The patient reports that the azelastine eyedrops helped temporarily but itching occurred within a few minutes of use.  The patient's symptoms are primarily ocular, with the whites of the eyes becoming red and itchy. Additionally, the patient reports that the lower part of the eyes swells and causes discomfort. The patient has not tried any over-the-counter treatments. This is the first occurrence of such symptoms for the patient, who moved to [place] three years ago. From [place]   The patient also reports a runny nose, which started around the same time as the eye symptoms. The nasal symptoms are sporadic and occur throughout the year. The patient has not been around animals recently and denies any recurrent ear infections. There have been no prior surgeries on the sinuses, nose, or ears. The patient reports a slight darkening of vision but denies any blurriness.  The patient has no known other medical conditions and is not on any medication. The patient's symptoms seem to be consistent with an allergic reaction, possibly to weed, pollen, or mold, which are common in the fall in [place].        Chart Review:  UC visits; 11/22/22, 01/03/23, 01/27/23  Treated with  prednisone in September, given Zyrtec and hydrocortisone cream in October, given Astelin eyedrops, triamcinolone 0.025% and continued on Zyrtec and hydrocortisone cream in November.  Other allergy screening: Asthma: no Rhino conjunctivitis: no Food allergy: no Medication allergy: no Hymenoptera allergy: no Urticaria: no Eczema: maybe  History of recurrent infections suggestive of immunodeficency: no Vaccinations are up to date.   Past Medical History: No past medical history on file. Medication List:  Current Outpatient Medications  Medication Sig Dispense Refill   cetirizine (ZYRTEC ALLERGY) 10 MG tablet Take 1 tablet (10 mg total) by mouth at bedtime. 30 tablet 2   cromolyn (OPTICROM) 4 % ophthalmic solution Place 1 drop into both eyes 4 (four) times daily. 10 mL 12   hydrocortisone cream 0.5 % Apply 1 Application topically 2 (two) times daily. 30 g 0   predniSONE (DELTASONE) 10 MG tablet Prednisone 10mg  : Take 2 tablets twice a day for 3 more days, Then take 2 tablets once a day for 1 day., then take 1 tablet once a day for 1 day. 15 tablet 0   tacrolimus (PROTOPIC) 0.1 % ointment Apply topically 2 (two) times daily. 100 g 0   triamcinolone (KENALOG) 0.025 % ointment Apply 1 Application topically 2 (two) times daily. Apply sparingly to the neck and face.  Avoid eyes.  Do not use for more than 7 days. 30 g 0   No current facility-administered medications for this visit.   Known Allergies:  Not on File Past Surgical History: No past surgical history on file. Family History: No family history on file. Social History: Lori Simmons lives single-family home, hardwood throughout.  Electric heating, central cooling.  No pets.  No roaches in the house and bed is 2 feet off the floor.  No dust mite precautions.  Not exposed to fumes, chemicals or dust.   ROS:  All other systems negative except as noted per HPI.  Objective:  Blood pressure 128/66, pulse 65, temperature 97.8 F (36.6 C),  temperature source Temporal, resp. rate 20, height 5' 0.75" (1.543 m), weight 137 lb 8 oz (62.4 kg), SpO2 97%. Body mass index is 26.19 kg/m. Physical Exam:  General Appearance:  Alert, cooperative, no distress, appears stated age  Head:  Normocephalic, without obvious abnormality, atraumatic  Eyes:  Conjunctiva injected left worse than right EOM's intact  Ears EACs normal bilaterally  Nose: Nares normal, normal mucosa, no visible anterior polyps, and septum midline  Throat: Lips, tongue normal; teeth and gums normal,  poor dentition  and normal posterior oropharynx  Neck: Supple, symmetrical  Lungs:   clear to auscultation bilaterally and wheezing throughout, Respirations unlabored, no coughing  Heart:  regular rate and rhythm and no murmur, Appears well perfused  Extremities: No edema  Skin: Erythematous patches and creases of mouth, bilateral upper eyelids, hyperpigmentation on lower eyelids  Neurologic: No gross deficits   Diagnostics:   Labs:  Lab Orders         Allergens, Zone 2       Assessment and Plan  Assessment and Plan    Allergic Conjunctivitis Atopic Dermatitis  New onset of eye itching and redness since September. Symptoms are persistent and not responsive to prednisone, Zyrtec, hydrocortisone, eye drops, and triamcinolone. Noted some discoloration and redness around the eyes. -Allergy test today positive to dust mite only, will confirm with blood work -Start avoidance measures for dust mite - Depo Medrol 40mg  IM given today  -Start prednisone 10mg  : Take 2 tablets twice a day for 3 more days, Then take 2 tablets once a day for 1 day., then take 1 tablet once a day for 1 day.  -Start cromolyn eyedrops 1 drop per both eye 4 times a day -Protopic 0.1% ointment twice a day on eyelids both upper and lower also around mouth -Start long-acting second-generation antihistamine such as Zyrtec (cetirizine), Allegra (fexofenadine), Claritin (loratadine)  -Can be purchased  cheaply off of [employer], or at [employer] or [employer].  Generic is okay -Consider referral to an ophthalmologist if symptoms do not improve with allergy eye drops and avoidance measures.  Allergic Rhinitis New onset of runny nose, possibly related to the same allergen causing the eye symptoms. No current treatment. - .(loratadine)  -Can be purchased cheaply off of [employer], or at [employer] or [employer].  Generic is okay  Follow up: we will call you with lab results and next steps          Other: samples provided of: Vanicream  Thank you for your kind referral. I appreciate the opportunity to take part in Lori Simmons's care. Please do not hesitate to contact me with questions.  Sincerely,  Thank you so much for letting me partake in your care today.  Don't hesitate to reach out if you have any additional concerns!  Ferol Luz, MD  [facility] [place]

## 2023-02-04 NOTE — Patient Instructions (Addendum)
Allergic Conjunctivitis Atopic Dermatitis  New onset of eye itching and redness since September. Symptoms are persistent and not responsive to prednisone, Zyrtec, hydrocortisone, eye drops, and triamcinolone. Noted some discoloration and redness around the eyes. -Allergy test today positive to dust mite only, will confirm with blood work -Start avoidance measures for dust mite - Depo Medrol 40mg  IM given today  -Start prednisone 10mg  : Take 2 tablets twice a day for 3 more days, Then take 2 tablets once a day for 1 day., then take 1 tablet once a day for 1 day.  -Start cromolyn eyedrops 1 drop per both eye 4 times a day -Protopic 0.1% ointment twice a day on eyelids both upper and lower also around mouth -Start long-acting second-generation antihistamine such as Zyrtec (cetirizine), Allegra (fexofenadine), Claritin (loratadine)  -Can be purchased cheaply off of [employer], or at [employer] or [employer].  Generic is okay -Consider referral to an ophthalmologist if symptoms do not improve with allergy eye drops and avoidance measures.  Allergic Rhinitis New onset of runny nose, possibly related to the same allergen causing the eye symptoms. No current treatment. - .(loratadine)  -Can be purchased cheaply off of [employer], or at [employer] or [employer].  Generic is okay  Follow up: we will call you with lab results and next steps   Thank you so much for letting me partake in your care today.  Don't hesitate to reach out if you have any additional concerns!  Ferol Luz, MD  [facility] [place], [place]  DUST MITE AVOIDANCE MEASURES:  There are three main measures that need and can be taken to avoid house dust mites:  Reduce accumulation of dust in general -reduce furniture, clothing, carpeting, books, stuffed animals, especially in bedroom  Separate yourself from the dust -use pillow and mattress encasements (can be found at stores such as [employer] or online) -avoid direct  exposure to air condition flow -use a HEPA filter device, especially in the bedroom; you can also use a HEPA filter vacuum cleaner -wipe dust with a moist towel instead of a dry towel or broom when cleaning  Decrease mites and/or their secretions -wash clothing and linen and stuffed animals at highest temperature possible, at least every 2 weeks -stuffed animals can also be placed in a bag and put in a freezer overnight  Despite the above measures, it is impossible to eliminate dust mites or their allergen completely from your home.  With the above measures the burden of mites in your home can be diminished, with the goal of minimizing your allergic symptoms.  Success will be reached only when implementing and using all means together.

## 2023-02-07 LAB — ALLERGENS, ZONE 2

## 2023-02-19 ENCOUNTER — Ambulatory Visit (HOSPITAL_BASED_OUTPATIENT_CLINIC_OR_DEPARTMENT_OTHER): Payer: BLUE CROSS/BLUE SHIELD | Admitting: Family Medicine

## 2023-02-19 ENCOUNTER — Ambulatory Visit: Payer: Self-pay | Admitting: Internal Medicine

## 2023-02-24 NOTE — Progress Notes (Signed)
Blood work was fairly negative for all environmentals.  Only borderline to dust mite.
